# Patient Record
Sex: Female | Born: 1940 | Race: White | Hispanic: No
Health system: Southern US, Community
[De-identification: ages and names within clinical notes are randomized; demographics above are authoritative.]

---

## 2001-12-07 ENCOUNTER — Encounter: Admission: RE | Admit: 2001-12-07 | Discharge: 2001-12-07 | Payer: Self-pay | Admitting: Family Medicine

## 2001-12-07 ENCOUNTER — Encounter: Payer: Self-pay | Admitting: Family Medicine

## 2002-08-10 ENCOUNTER — Encounter: Payer: Self-pay | Admitting: Gastroenterology

## 2002-08-10 ENCOUNTER — Encounter: Admission: RE | Admit: 2002-08-10 | Discharge: 2002-08-10 | Payer: Self-pay | Admitting: Gastroenterology

## 2003-04-30 ENCOUNTER — Inpatient Hospital Stay (HOSPITAL_COMMUNITY): Admission: AD | Admit: 2003-04-30 | Discharge: 2003-04-30 | Payer: Self-pay | Admitting: *Deleted

## 2003-05-16 ENCOUNTER — Other Ambulatory Visit: Admission: RE | Admit: 2003-05-16 | Discharge: 2003-05-16 | Payer: Self-pay | Admitting: Obstetrics & Gynecology

## 2003-06-05 ENCOUNTER — Other Ambulatory Visit: Admission: RE | Admit: 2003-06-05 | Discharge: 2003-06-05 | Payer: Self-pay | Admitting: *Deleted

## 2007-08-02 ENCOUNTER — Emergency Department (HOSPITAL_COMMUNITY): Admission: EM | Admit: 2007-08-02 | Discharge: 2007-08-02 | Payer: Self-pay | Admitting: Emergency Medicine
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
## 2008-10-28 ENCOUNTER — Encounter: Admission: RE | Admit: 2008-10-28 | Discharge: 2008-10-28 | Payer: Self-pay | Admitting: Family Medicine

## 2008-11-01 ENCOUNTER — Encounter: Admission: RE | Admit: 2008-11-01 | Discharge: 2008-11-01 | Payer: Self-pay | Admitting: Family Medicine

## 2009-08-28 ENCOUNTER — Emergency Department (HOSPITAL_COMMUNITY): Admission: EM | Admit: 2009-08-28 | Discharge: 2009-08-28 | Payer: Self-pay | Admitting: Family Medicine

## 2014-05-21 ENCOUNTER — Other Ambulatory Visit: Payer: Self-pay | Admitting: Family Medicine

## 2014-05-21 DIAGNOSIS — M810 Age-related osteoporosis without current pathological fracture: Secondary | ICD-10-CM

## 2014-05-30 ENCOUNTER — Ambulatory Visit
Admission: RE | Admit: 2014-05-30 | Discharge: 2014-05-30 | Disposition: A | Discharge status: Home / Self Care | Payer: Medicaid Other | Source: Ambulatory Visit | Attending: Family Medicine | Admitting: Family Medicine

## 2014-05-30 DIAGNOSIS — M810 Age-related osteoporosis without current pathological fracture: Secondary | ICD-10-CM

## 2017-07-05 ENCOUNTER — Other Ambulatory Visit: Payer: Self-pay | Admitting: Family Medicine

## 2017-07-05 DIAGNOSIS — M8580 Other specified disorders of bone density and structure, unspecified site: Secondary | ICD-10-CM

## 2017-07-25 ENCOUNTER — Ambulatory Visit
Admission: RE | Admit: 2017-07-25 | Discharge: 2017-07-25 | Disposition: A | Discharge status: Home / Self Care | Payer: Medicare Other | Source: Ambulatory Visit | Attending: Family Medicine | Admitting: Family Medicine

## 2017-07-25 DIAGNOSIS — M8580 Other specified disorders of bone density and structure, unspecified site: Secondary | ICD-10-CM

## 2022-08-18 DEATH — deceased
